# Patient Record
Sex: Male | Born: 1935 | Race: Black or African American | Hispanic: No | Marital: Married | Smoking: Former smoker
Health system: Southern US, Community
[De-identification: ages and names within clinical notes are randomized; demographics above are authoritative.]

## PROBLEM LIST (undated history)

## (undated) DIAGNOSIS — E7800 Pure hypercholesterolemia, unspecified: Secondary | ICD-10-CM

## (undated) DIAGNOSIS — I10 Essential (primary) hypertension: Secondary | ICD-10-CM

## (undated) HISTORY — DX: Pure hypercholesterolemia, unspecified: E78.00

## (undated) HISTORY — DX: Essential (primary) hypertension: I10

---

## 2002-09-18 ENCOUNTER — Observation Stay (HOSPITAL_COMMUNITY): Admission: EM | Admit: 2002-09-18 | Discharge: 2002-09-20 | Payer: Self-pay | Admitting: Internal Medicine

## 2002-09-18 ENCOUNTER — Encounter: Payer: Self-pay | Admitting: Internal Medicine

## 2007-03-12 ENCOUNTER — Emergency Department (HOSPITAL_COMMUNITY): Admission: EM | Admit: 2007-03-12 | Discharge: 2007-03-12 | Payer: Self-pay | Admitting: Emergency Medicine

## 2007-08-15 ENCOUNTER — Encounter: Admission: RE | Admit: 2007-08-15 | Discharge: 2007-08-15 | Payer: Self-pay | Admitting: Family Medicine

## 2009-09-20 ENCOUNTER — Inpatient Hospital Stay (HOSPITAL_COMMUNITY): Admission: EM | Admit: 2009-09-20 | Discharge: 2009-09-22 | Payer: Self-pay | Admitting: Emergency Medicine

## 2010-08-24 LAB — GLUCOSE, CAPILLARY

## 2010-08-24 LAB — BASIC METABOLIC PANEL
BUN: 10 mg/dL (ref 6–23)
CO2: 28 mEq/L (ref 19–32)
Chloride: 105 mEq/L (ref 96–112)
Creatinine, Ser: 0.94 mg/dL (ref 0.4–1.5)
GFR calc Af Amer: >60 mL/min (ref >60)
GFR calc non Af Amer: >60 mL/min (ref >60)
Glucose, Bld: 140 mg/dL — ABNORMAL HIGH (ref 70–99)
Potassium: 3.4 mEq/L — ABNORMAL LOW (ref 3.5–5.1)
Sodium: 139 mEq/L (ref 135–145)

## 2010-08-24 LAB — DIFFERENTIAL
Basophils Absolute: 0 10*3/uL (ref 0.0–0.1)
Eosinophils Relative: 4 % (ref 0–5)
Lymphocytes Relative: 33 % (ref 12–46)
Lymphs Abs: 2.5 10*3/uL (ref 0.7–4.0)
Monocytes Absolute: 0.6 10*3/uL (ref 0.1–1.0)
Monocytes Relative: 8 % (ref 3–12)

## 2010-08-24 LAB — CBC
Hemoglobin: 15 g/dL (ref 13.0–17.0)
MCV: 93.2 fL (ref 78.0–100.0)
RBC: 4.73 MIL/uL (ref 4.22–5.81)
RDW: 14 % (ref 11.5–15.5)

## 2010-08-24 LAB — POCT CARDIAC MARKERS
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
## 2010-10-22 NOTE — Procedures (Signed)
   NAMEJOHNNEY, SCARLATA NO.:  [id number]   MEDICAL RECORD NO.:  [id number]                   PATIENT TYPE:  INP   LOCATION:  A212                                 FACILITY:  [facility]   PHYSICIAN:  Vida Roller, M.D.                DATE OF BIRTH:  04/29/1931   DATE OF PROCEDURE:  DATE OF DISCHARGE:                                    STRESS TEST   EXERCISE CARDIOLITE   INDICATION:  The patient is a 77 year old male with no known coronary artery  disease who presented with atypical chest discomfort.  He has ruled out for  myocardial infarction with three sets of negative cardiac enzymes.  His  cardiac risk factors include tobacco abuse, age, male sex, and unknown  lipids.   BASELINE DATA:  EKG shows sinus rhythm at 55 beats per minute with  nonspecific ST abnormalities.  Blood pressure is 130/74.   DESCRIPTION:  The patient exercised for a total of seven minutes 44 seconds  into Bruce protocol stage 3 and 10.1 METS.  Maximum heart rate achieved was  135 beats per minute which is 89% of maximum predicted.  Maximum blood  pressure was 200/90.  EKG showed few PVCs and no ischemic changes.   Final images and results are pending M.D. review.     Amy Mercy Riding, P.A. [facility]                     Vida Roller, M.D.    AB/MEDQ  D:  09/20/2002  T:  09/20/2002  Job:  [id number]

## 2010-12-15 ENCOUNTER — Other Ambulatory Visit (HOSPITAL_COMMUNITY): Payer: Self-pay | Admitting: Family Medicine

## 2010-12-15 ENCOUNTER — Ambulatory Visit (HOSPITAL_COMMUNITY)
Admission: RE | Admit: 2010-12-15 | Discharge: 2010-12-15 | Disposition: A | Discharge status: Home / Self Care | Payer: Medicare Other | Source: Ambulatory Visit | Attending: Family Medicine | Admitting: Family Medicine

## 2010-12-15 DIAGNOSIS — R52 Pain, unspecified: Secondary | ICD-10-CM

## 2010-12-15 DIAGNOSIS — M899 Disorder of bone, unspecified: Secondary | ICD-10-CM | POA: Insufficient documentation

## 2010-12-15 DIAGNOSIS — R05 Cough: Secondary | ICD-10-CM

## 2010-12-15 DIAGNOSIS — R9389 Abnormal findings on diagnostic imaging of other specified body structures: Secondary | ICD-10-CM | POA: Insufficient documentation

## 2010-12-15 DIAGNOSIS — M25559 Pain in unspecified hip: Secondary | ICD-10-CM | POA: Insufficient documentation

## 2010-12-20 ENCOUNTER — Other Ambulatory Visit (HOSPITAL_COMMUNITY): Payer: Self-pay | Admitting: Family Medicine

## 2010-12-20 DIAGNOSIS — R9389 Abnormal findings on diagnostic imaging of other specified body structures: Secondary | ICD-10-CM

## 2010-12-27 ENCOUNTER — Encounter (HOSPITAL_COMMUNITY): Payer: Self-pay

## 2010-12-27 ENCOUNTER — Ambulatory Visit (HOSPITAL_COMMUNITY)
Admission: RE | Admit: 2010-12-27 | Discharge: 2010-12-27 | Disposition: A | Discharge status: Home / Self Care | Payer: Medicare Other | Source: Ambulatory Visit | Attending: Family Medicine | Admitting: Family Medicine

## 2010-12-27 DIAGNOSIS — R918 Other nonspecific abnormal finding of lung field: Secondary | ICD-10-CM | POA: Insufficient documentation

## 2010-12-27 DIAGNOSIS — R9389 Abnormal findings on diagnostic imaging of other specified body structures: Secondary | ICD-10-CM

## 2010-12-27 DIAGNOSIS — J984 Other disorders of lung: Secondary | ICD-10-CM | POA: Insufficient documentation

## 2010-12-27 MED ORDER — IOHEXOL 300 MG/ML  SOLN
100.0000 mL | Freq: Once | INTRAMUSCULAR | Status: AC | PRN
  Administered 2010-12-27: 100 mL via INTRAVENOUS

## 2011-01-17 ENCOUNTER — Ambulatory Visit (INDEPENDENT_AMBULATORY_CARE_PROVIDER_SITE_OTHER): Payer: Medicare Other | Admitting: Emergency Medicine

## 2011-01-17 ENCOUNTER — Encounter: Payer: Self-pay | Admitting: Emergency Medicine

## 2011-01-17 VITALS — BP 170/82 | HR 77 | Temp 98.1°F | Ht 71.0 in | Wt 225.0 lb

## 2011-01-17 DIAGNOSIS — J984 Other disorders of lung: Secondary | ICD-10-CM

## 2011-01-17 DIAGNOSIS — R911 Solitary pulmonary nodule: Secondary | ICD-10-CM | POA: Insufficient documentation

## 2011-01-17 NOTE — Progress Notes (Signed)
Addended by: Michel Bickers A on: 01/17/2011 04:00 PM   Modules accepted: Orders

## 2011-01-17 NOTE — Assessment & Plan Note (Signed)
Discussed options in detail. I offered PET even though sensitivity may be poor given the size of the nodule and LAD. He wants to think about the PET. For now we are planning f/u CT scan in October for interval change

## 2011-01-17 NOTE — Progress Notes (Signed)
  Subjective:    Patient ID: Jack Chapman, male    DOB: 22-Sep-1931, 77 y.o.   MRN: [id number]  HPI 77 yo man, active smoker, hx HTN, DM, hyperlipidemia. Referred by Dr Mirna Mires for an abnormal CT scan of the chest. Was seen in July for hip pain and cough. Prompted a CXR and then CT scan that show LUL nodule 0.7 cm and some slightly enlarged central nodes. He presents for eval of the abnormal CT scan   Review of Systems  Constitutional: Negative.  Negative for fever, activity change, appetite change and fatigue.  HENT: Negative.  Negative for congestion, rhinorrhea, sneezing, postnasal drip and sinus pressure.   Eyes: Negative.   Respiratory: Positive for cough (baseline, in the am - prod clear/white). Negative for chest tightness, shortness of breath, wheezing and stridor.   Cardiovascular: Negative.  Negative for chest pain.  Gastrointestinal: Negative.   Genitourinary: Negative.   Musculoskeletal: Negative.  Negative for back pain.  Skin: Negative.   Neurological: Negative.   Hematological: Negative.   Psychiatric/Behavioral: Negative.    Past Medical History  Diagnosis Date  . Hypertension   . Diabetes mellitus   . Hypercholesterolemia      Family History  Problem Relation Age of Onset  . Vaginal cancer Mother      History   Social History  . Marital Status: Married    Spouse Name: N/A    Number of Children: N/A  . Years of Education: N/A   Occupational History  . retired     Designer, jewellery   Social History Main Topics  . Smoking status: Current Everyday Smoker -- 1.0 packs/day for 65 years  . Smokeless tobacco: Not on file  . Alcohol Use: No  . Drug Use: No  . Sexually Active: Not on file   Other Topics Concern  . Not on file   Social History Narrative  . No narrative on file     No Known Allergies   No outpatient prescriptions prior to visit.         Objective:   Physical Exam Gen: Pleasant, obese, in no distress,  normal  affect  ENT: No lesions,  mouth clear,  oropharynx clear, no postnasal drip  Neck: No JVD, no TMG, no carotid bruits  Lungs: No use of accessory muscles, no dullness to percussion, clear without rales or rhonchi  Cardiovascular: RRR, heart sounds normal, no murmur or gallops, no peripheral edema  Musculoskeletal: No deformities, no cyanosis or clubbing  Neuro: alert, non focal  Skin: Warm, no lesions or rashes      Assessment & Plan:  Pulmonary nodule, left Discussed options in detail. I offered PET even though sensitivity may be poor given the size of the nodule and LAD. He wants to think about the PET. For now we are planning f/u CT scan in October for interval change

## 2011-01-17 NOTE — Patient Instructions (Signed)
We will schedule a CT scan of the chest to be done at [facility] in late October.  Please follow up with Dr Delton Coombes after your CT scan with full pulmonary function testing on the same day.

## 2011-04-04 ENCOUNTER — Other Ambulatory Visit: Payer: Medicare Other

## 2012-05-06 DEATH — deceased

## 2013-01-08 IMAGING — CR DG HIP (WITH OR WITHOUT PELVIS) 2-3V*L*
3 series · 3 of 3 positions shown · non-contrast
Comparison: None

CLINICAL DATA: Left side pain for 1 week, no known injuries

LEFT HIP - COMPLETE 2+ VIEW

[view not recorded (1 of 3)]
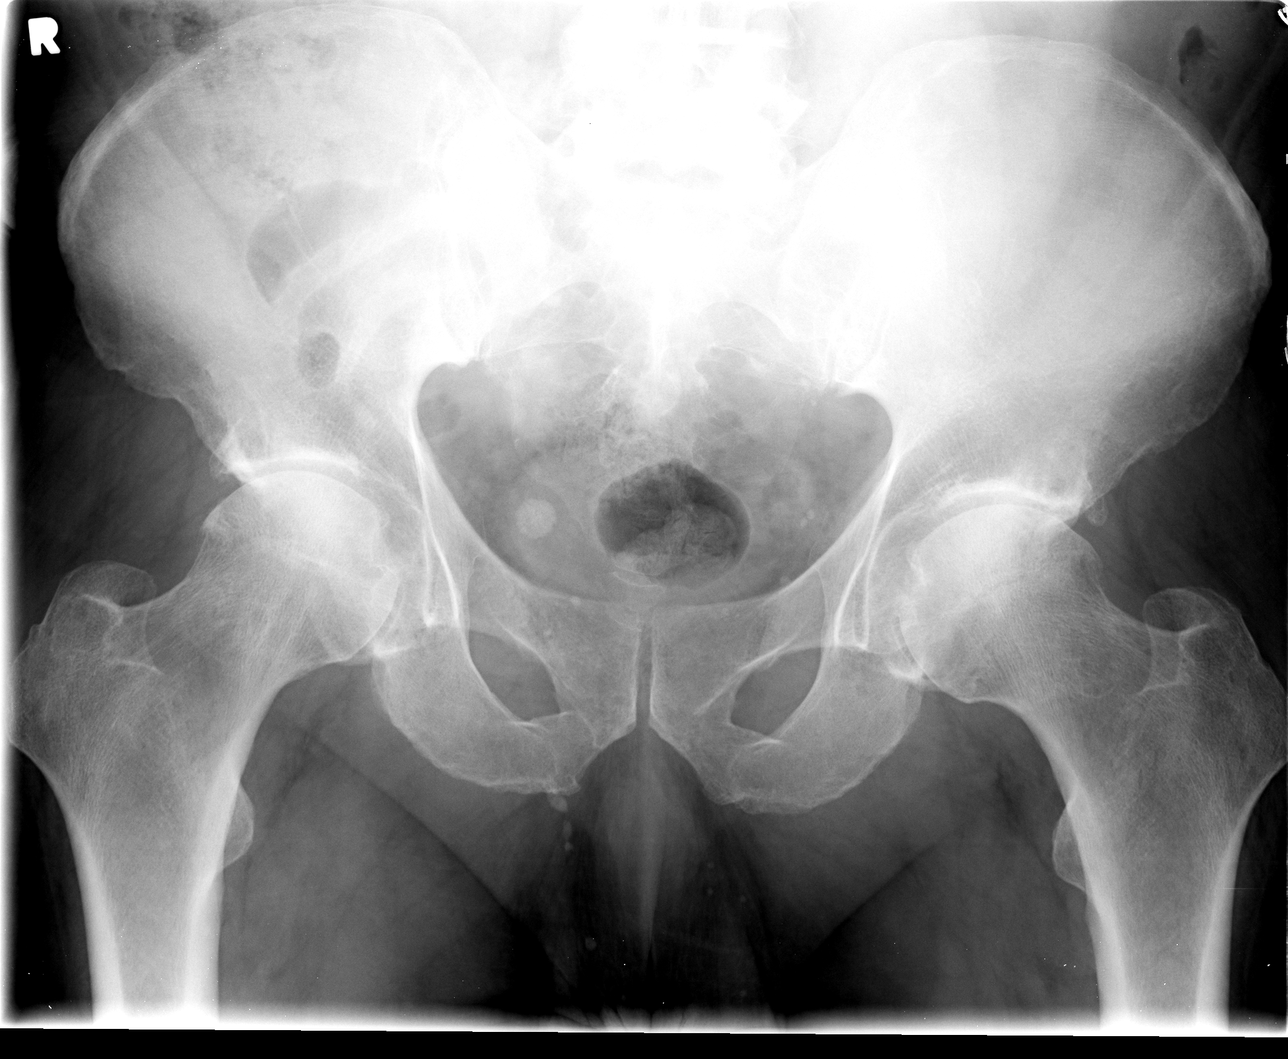

[view not recorded (2 of 3)]
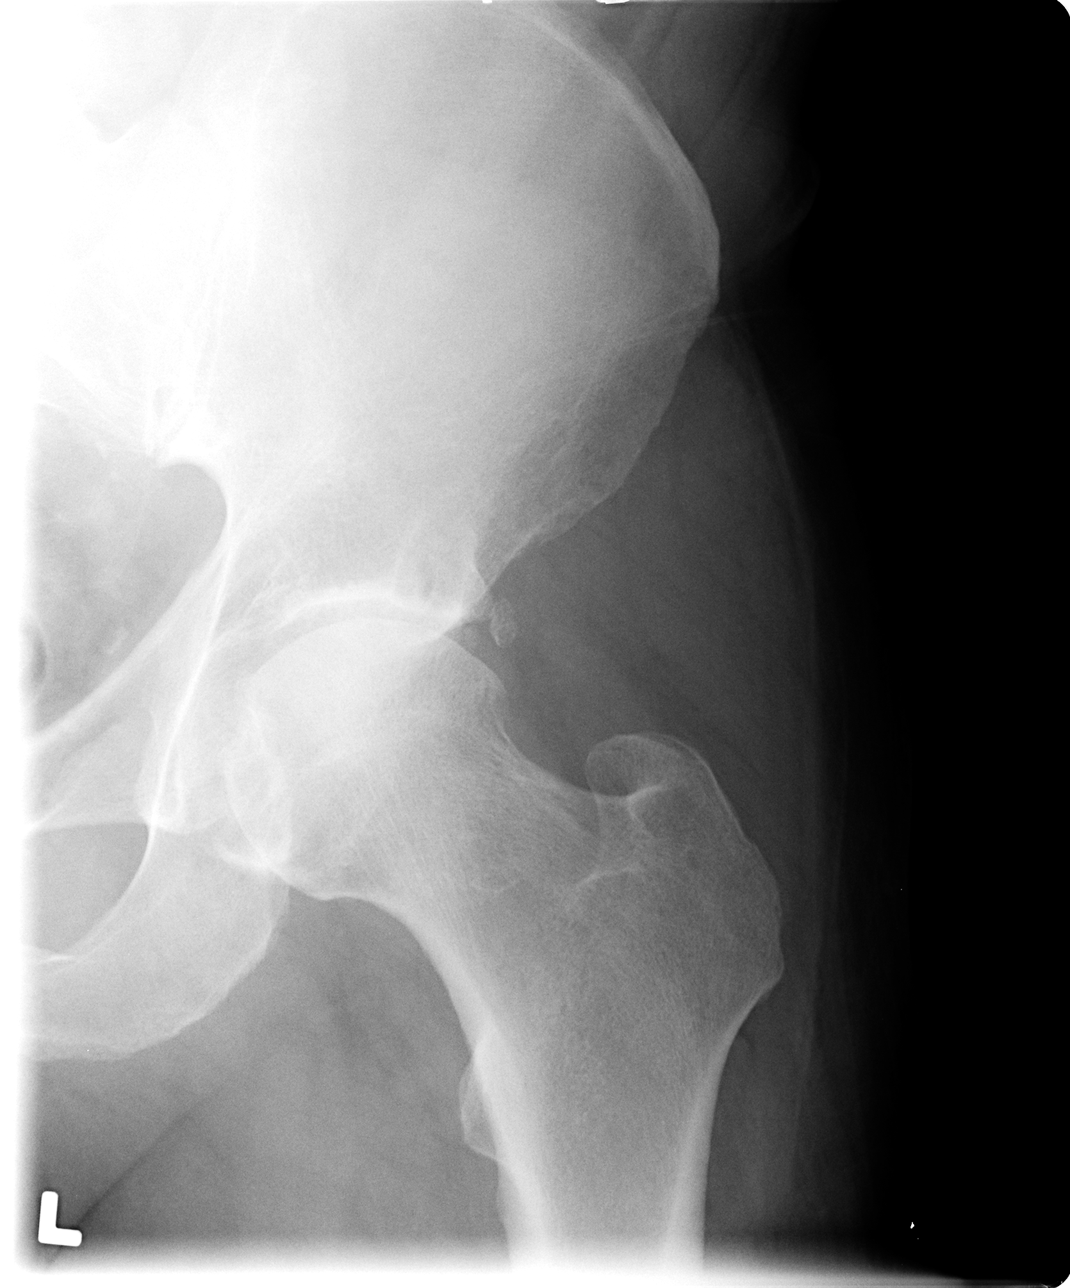

[view not recorded (3 of 3)]
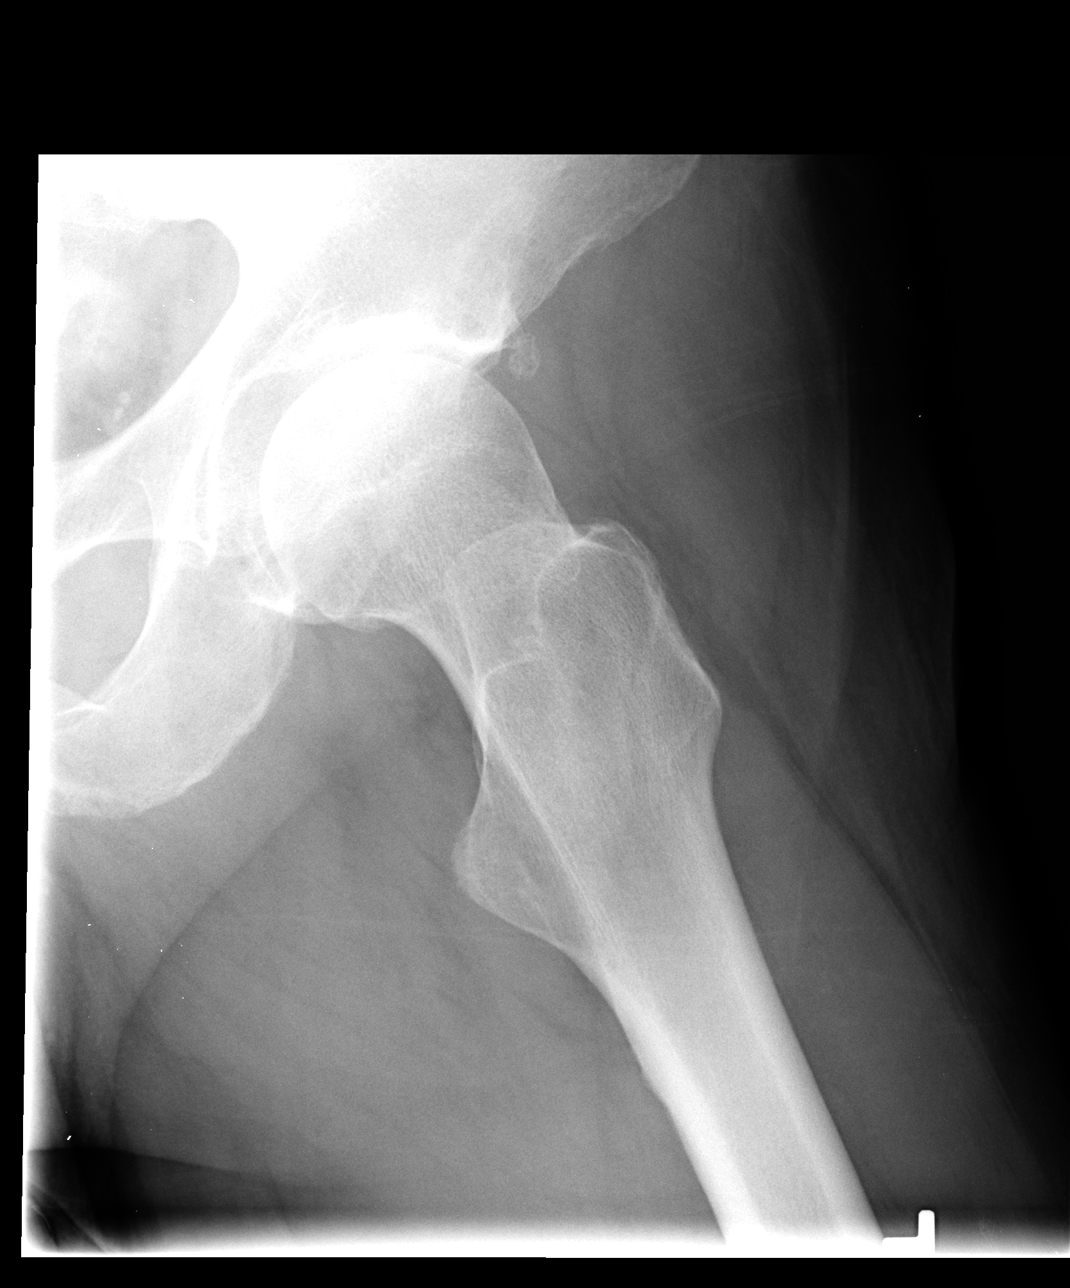

[3 of 3 positions shown; findings below may reference images not displayed]

FINDINGS: Diffuse osseous demineralization.
Mild bilateral narrowing of hip joints.
SI joints symmetric.
No acute fracture, dislocation, or bone destruction.
Numerous pelvic phleboliths.
Larger rounded calcification in right pelvis 1.3 cm diameter may
represent a large phlebolith though a vesicular calculus is not
excluded.
Visualized bowel gas pattern normal.
IMPRESSION: Mild bilateral hip joint degenerative changes.
Osseous demineralization.
Question of large phlebolith versus bladder calculus lower right
pelvis; no prior exams to confirm stability of this finding.

## 2013-01-20 IMAGING — CT CT CHEST W/ CM
2 of 3 series · 15 of 36 positions shown, 18 images · IV contrast (Omnipaque 300)
Comparison: Chest radiograph dated 12/15/2010

CLINICAL DATA: Abnormal chest radiograph, possible 8 mm left upper
lobe nodule

CT CHEST WITH CONTRAST
TECHNIQUE: Multidetector CT imaging of the chest was performed
following the standard protocol during bolus administration of
intravenous contrast.
Contrast: 80 ml Nmnipaque-F00 IV

[Series 2: chestroutine 5.0 b40f · axial · 0.72mm/px · z∈[-369,-64]mm · 12 of 73 slices shown, 15 images]
[im 6/73  mediastinal]
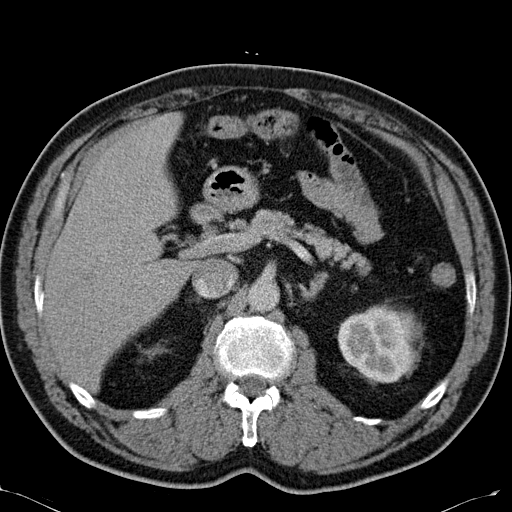
[im 6/73  lung]
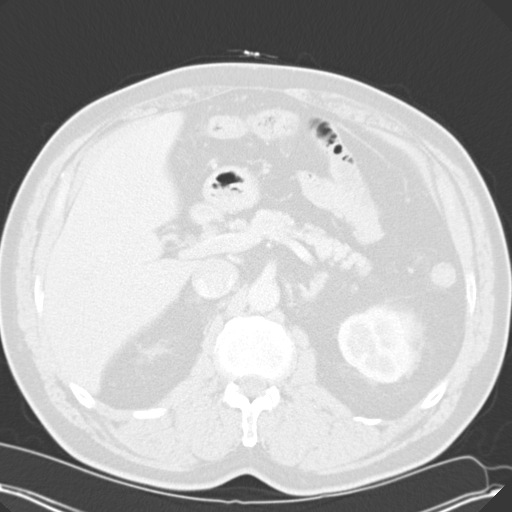
[im 11/73  lung]
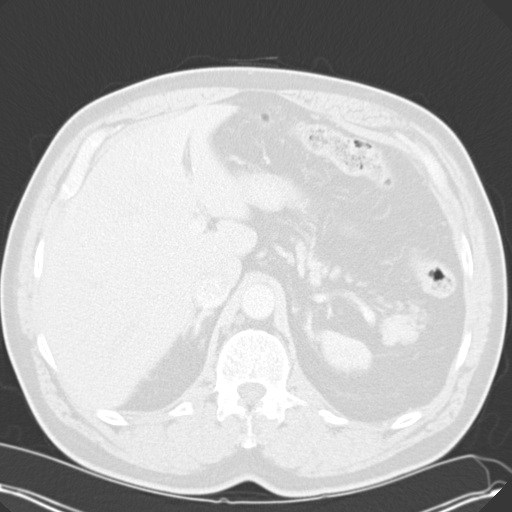
[im 17/73  lung]
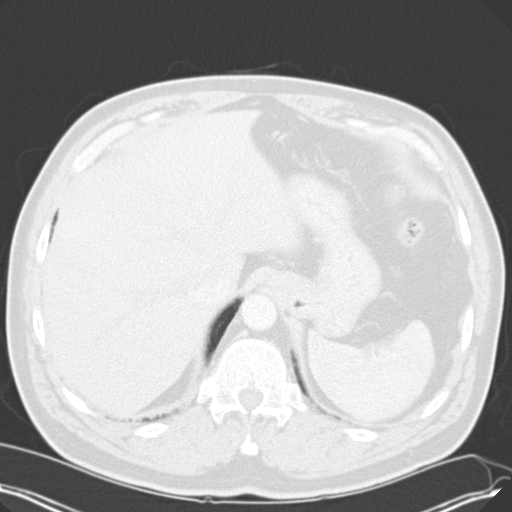
[im 22/73  lung]
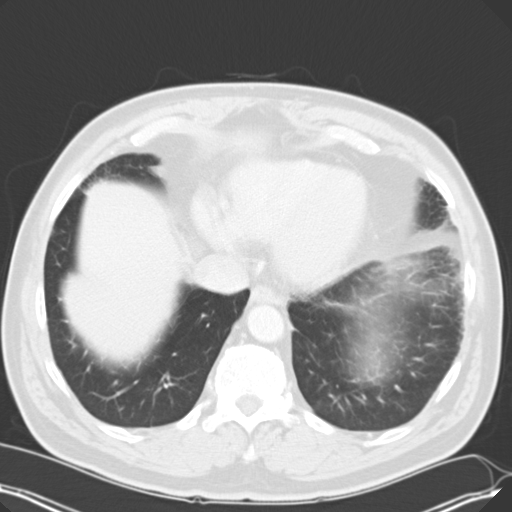
[im 27/73  mediastinal]
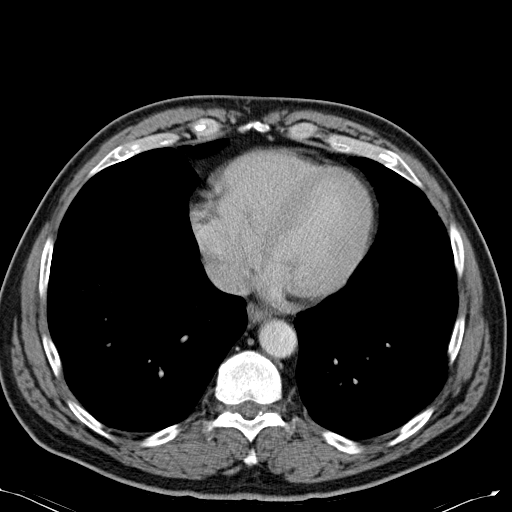
[im 27/73  lung]
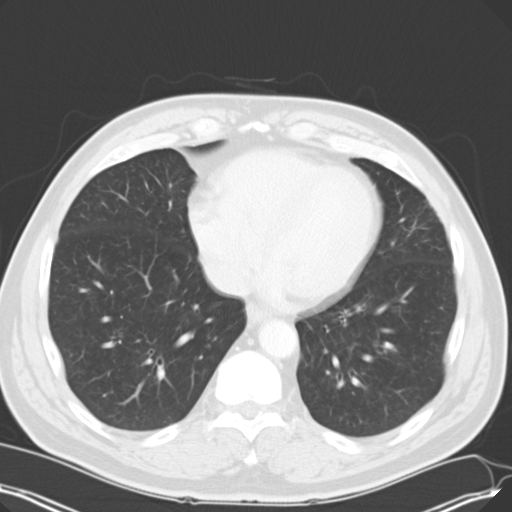
[im 33/73  lung]
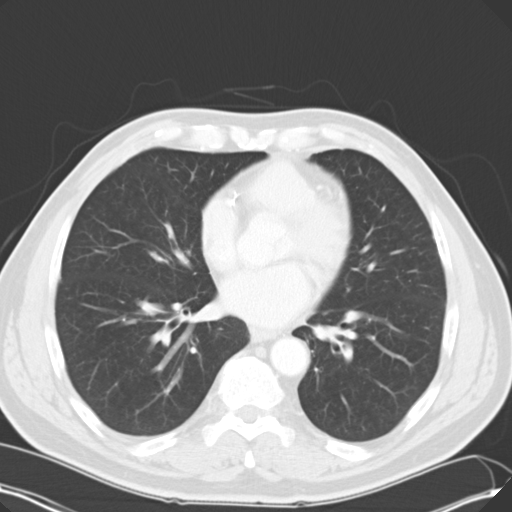
[im 41/73  lung]
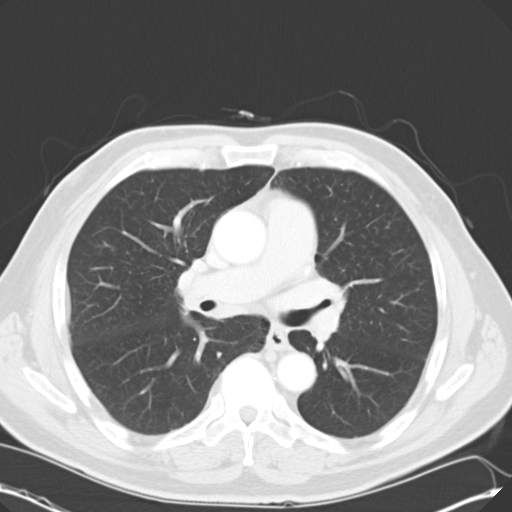
[im 46/73  lung]
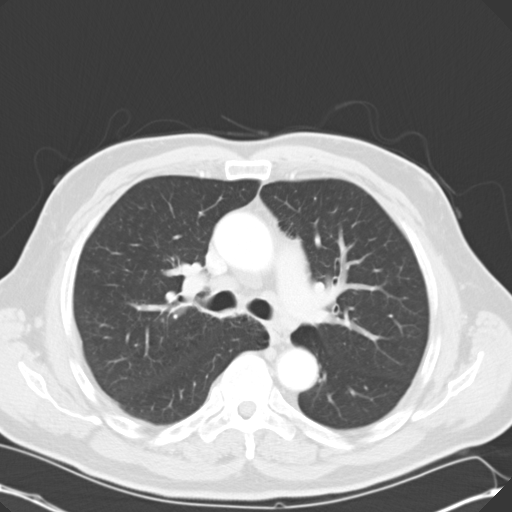
[im 51/73  mediastinal]
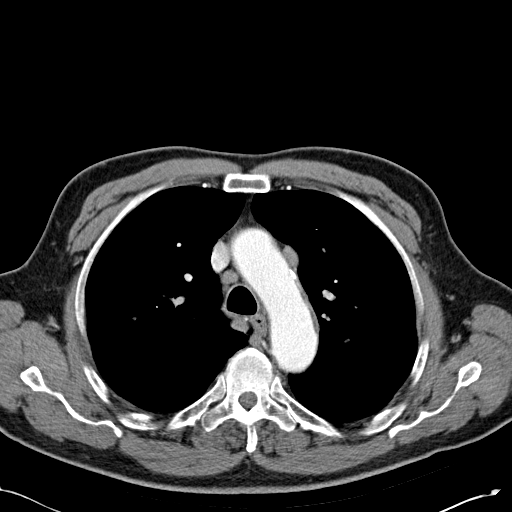
[im 51/73  lung]
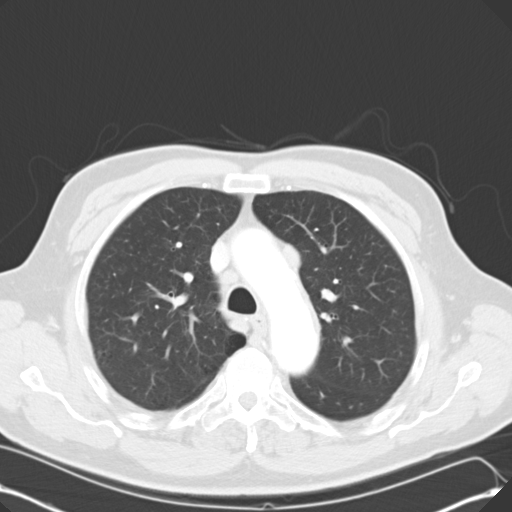
[im 57/73  lung]
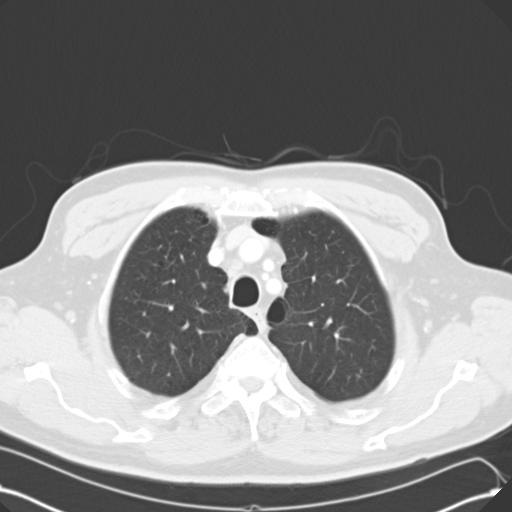
[im 62/73  lung]
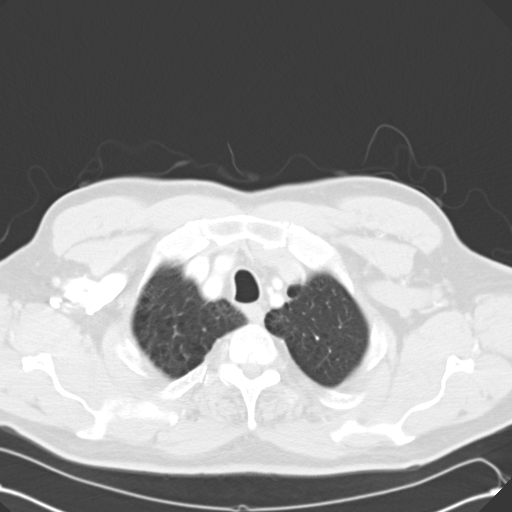
[im 67/73  lung]
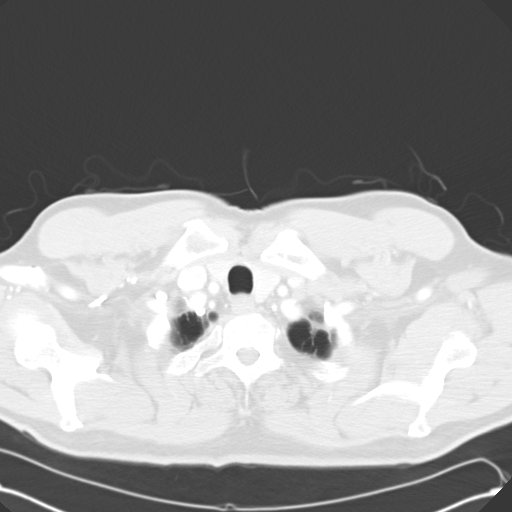

[Series 4: mpr coronal chest 3mm · coronal · 0.72mm/px · 3 of 106 slices shown]
[im 22/106  lung]
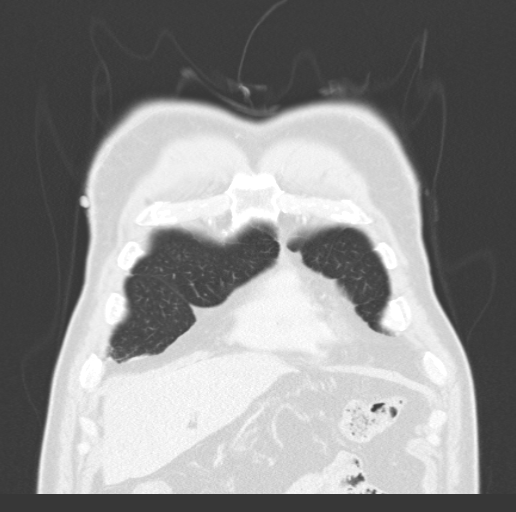
[im 43/106  lung]
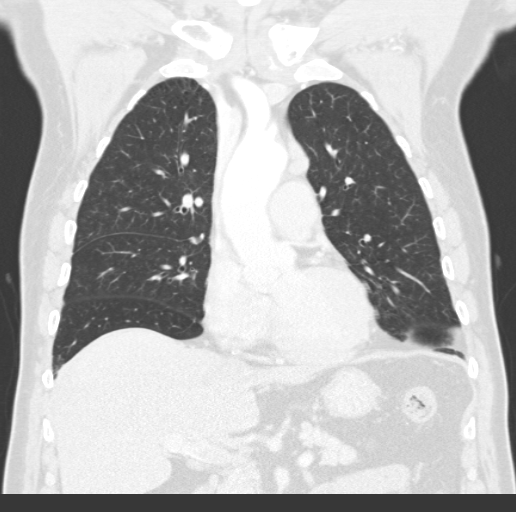
[im 64/106  lung]
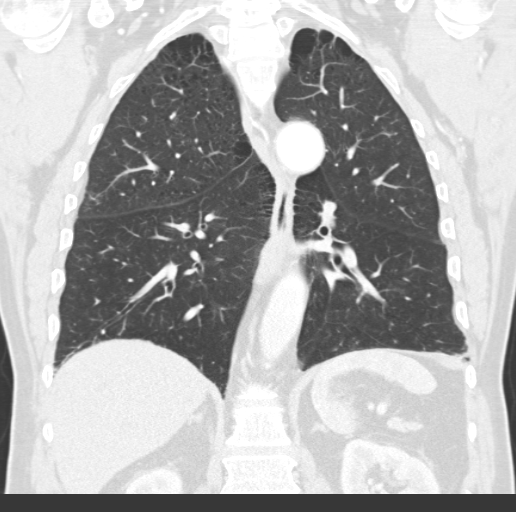

[15 of 36 positions shown; findings below may reference images not displayed]

FINDINGS: Visualized thyroid is unremarkable.

Paraseptal and centrilobular emphysema.  7 mm pulmonary nodule with
possible spiculated borders in the left upper lobe (series 3/image
21), correlating with the radiographic finding.  The
appearance/borders of the nodule are worrisome.  No additional
suspicious pulmonary nodules.  No pleural effusion or
pneumothorax.

The heart is normal in size.  No pericardial effusion.  Coronary
atherosclerosis. Atherosclerotic calcifications of the aortic arch.

There is scattered mediastinal lymph nodes, including a 10 mm short-
axis right paratracheal node (series 2/image 24), an 11 mm short-
axis prevascular node (series 2/image 25), and an 11 mm short-axis
subcarinal node (series 2/image 34).

Degenerative changes of the visualized thoracolumbar spine.
IMPRESSION: 7 mm left upper lobe pulmonary nodule, corresponding to the
radiographic abnormality, with possible spiculated borders.  This
appearance is worrisome for malignancy.  If no intervention is
performed, follow-up CT chest is suggested in 3-6 months per
[HOSPITAL] guidelines.

Small mediastinal lymph nodes, measuring up to 11 mm short-axis, as
described above.  The significance of this finding is uncertain,
but it is unlikely to be related to the pulmonary nodule given the
nodule's size.

## 2013-12-01 ENCOUNTER — Emergency Department (HOSPITAL_COMMUNITY)
Admission: EM | Admit: 2013-12-01 | Payer: Medicare Other | Source: Home / Self Care | Attending: Emergency Medicine | Admitting: Emergency Medicine

## 2013-12-01 ENCOUNTER — Encounter (HOSPITAL_COMMUNITY): Payer: Self-pay | Admitting: Emergency Medicine

## 2013-12-01 NOTE — ED Notes (Signed)
PT c/o worsening in SOB x1 day.
